# Patient Record
Sex: Female | Born: 1995 | Race: Black or African American | Hispanic: No | Marital: Single | Smoking: Never smoker
Health system: Southern US, Community
[De-identification: ages and names within clinical notes are randomized; demographics above are authoritative.]

---

## 2013-08-07 ENCOUNTER — Other Ambulatory Visit: Payer: Self-pay | Admitting: Emergency Medicine

## 2013-08-07 DIAGNOSIS — R102 Pelvic and perineal pain: Secondary | ICD-10-CM

## 2013-08-12 ENCOUNTER — Other Ambulatory Visit: Payer: Self-pay

## 2013-08-14 ENCOUNTER — Ambulatory Visit
Admission: RE | Admit: 2013-08-14 | Discharge: 2013-08-14 | Disposition: A | Discharge status: Home / Self Care | Payer: BC Managed Care – PPO | Source: Ambulatory Visit | Attending: Emergency Medicine | Admitting: Emergency Medicine

## 2013-08-14 DIAGNOSIS — R102 Pelvic and perineal pain: Secondary | ICD-10-CM

## 2014-04-12 IMAGING — US US PELVIS COMPLETE
1 series · 14 of 25 positions shown · non-contrast
Comparison: None.

CLINICAL DATA: Pelvic pain, cramps, nausea and vomiting

EXAM:
TRANSABDOMINAL ULTRASOUND OF PELVIS
TECHNIQUE: Transabdominal ultrasound examination of the pelvis was performed
including evaluation of the uterus, ovaries, adnexal regions, and
pelvic cul-de-sac.

[Series 1: us pelvis complete · 0.23mm/px · 14 of 48 slices shown]
[im 1/48]
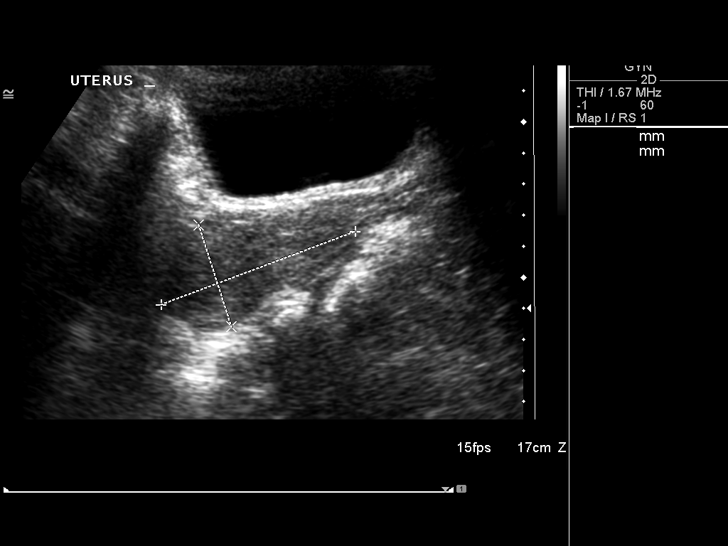
[im 4/48]
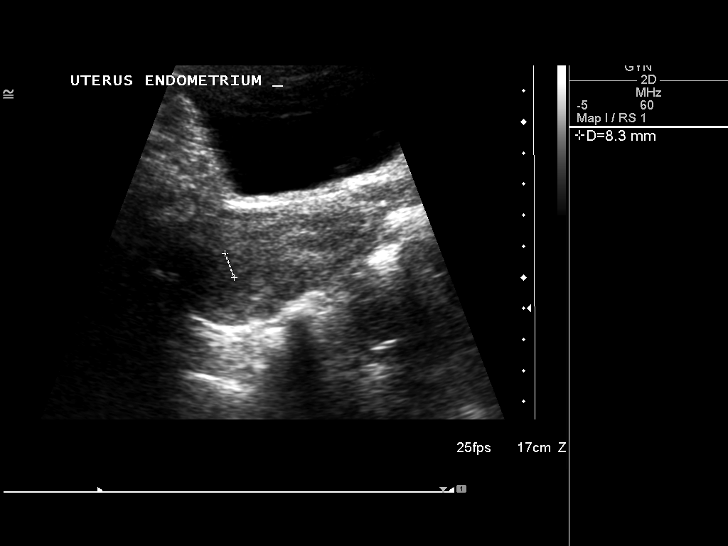
[im 8/48]
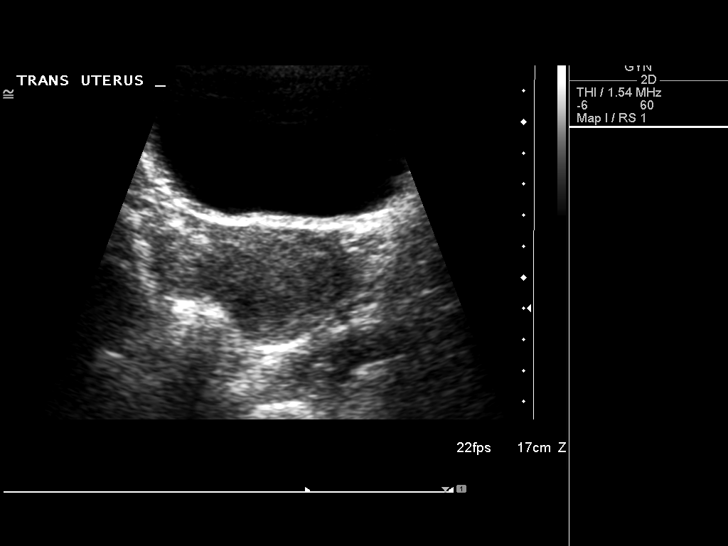
[im 12/48]
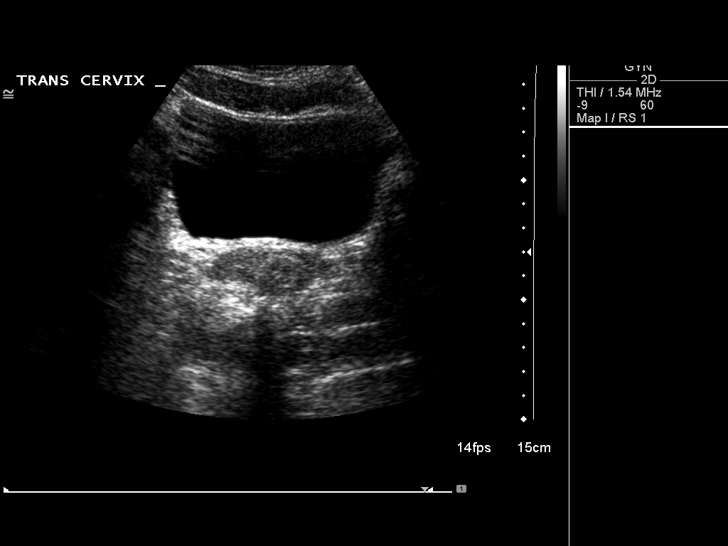
[im 16/48]
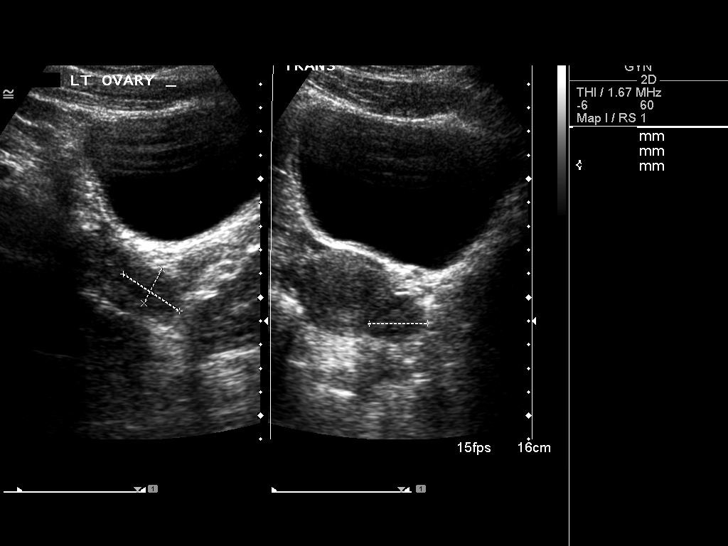
[im 18/48]
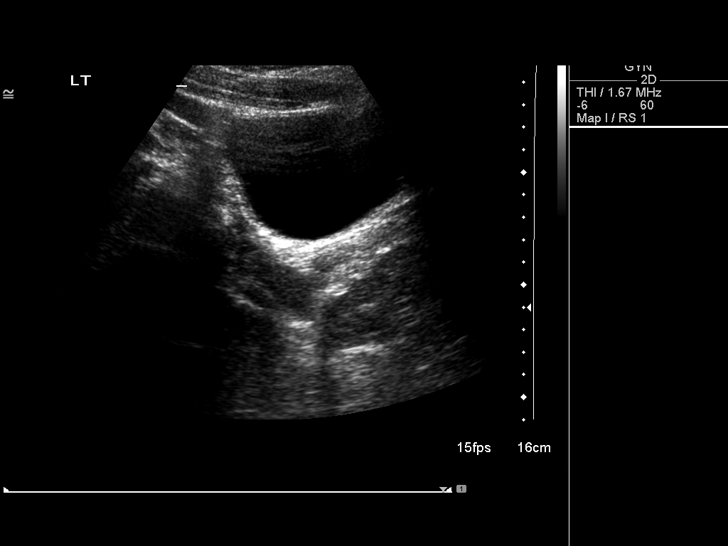
[im 22/48]
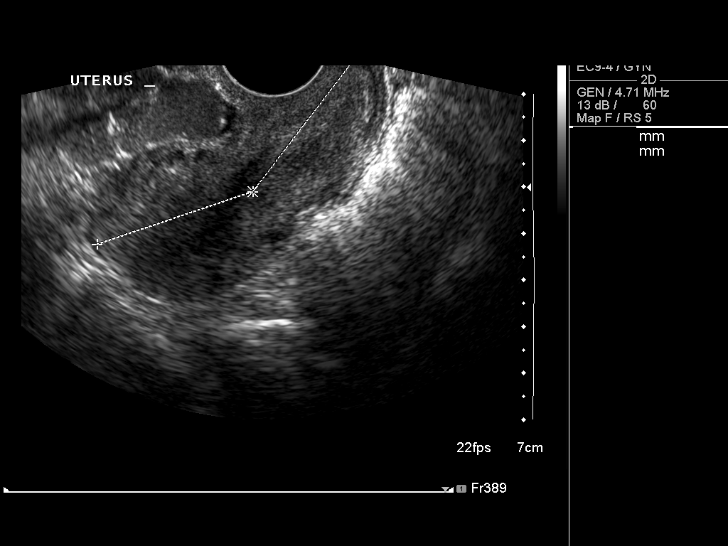
[im 26/48]
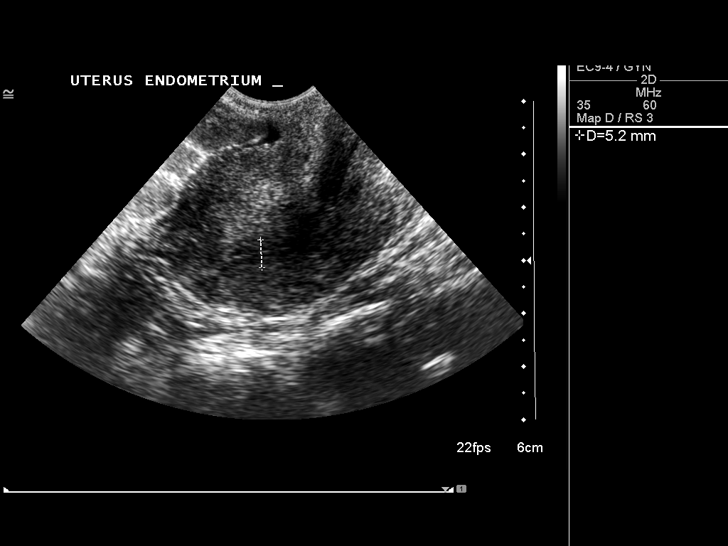
[im 30/48]
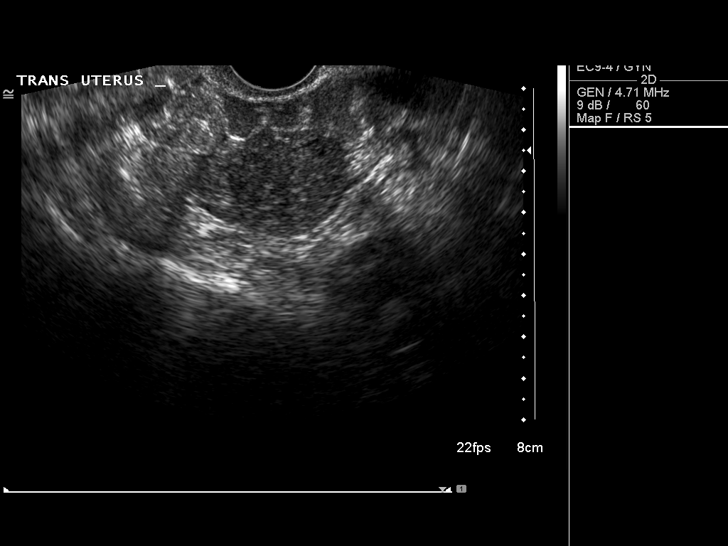
[im 32/48]
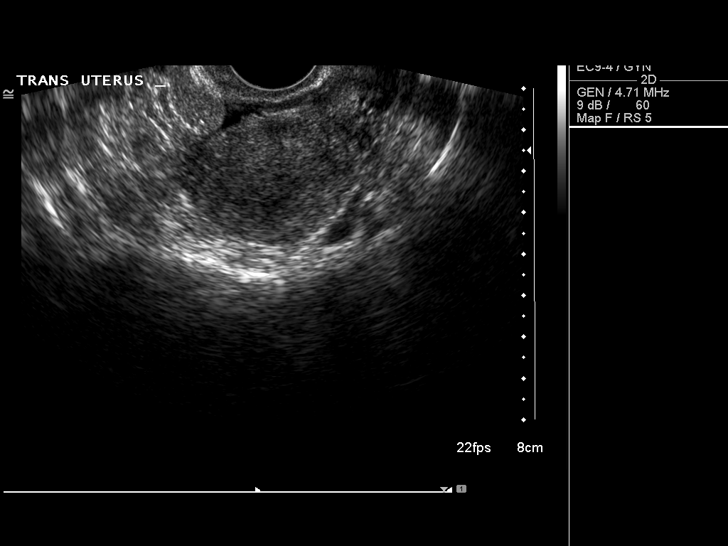
[im 36/48]
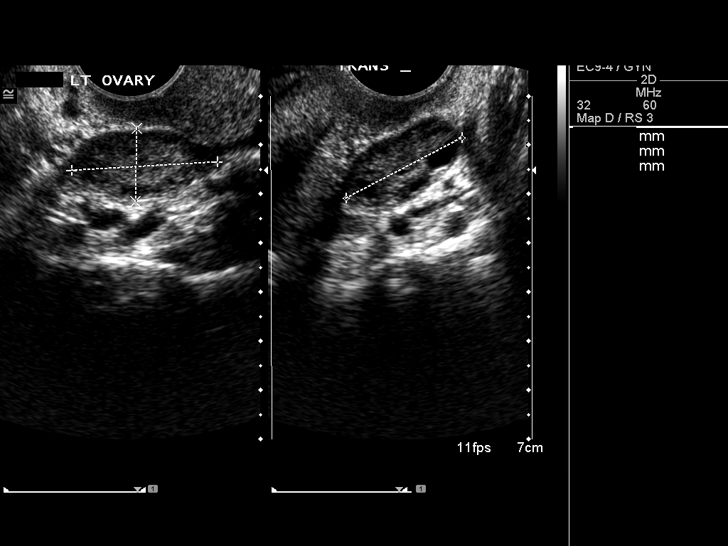
[im 40/48]
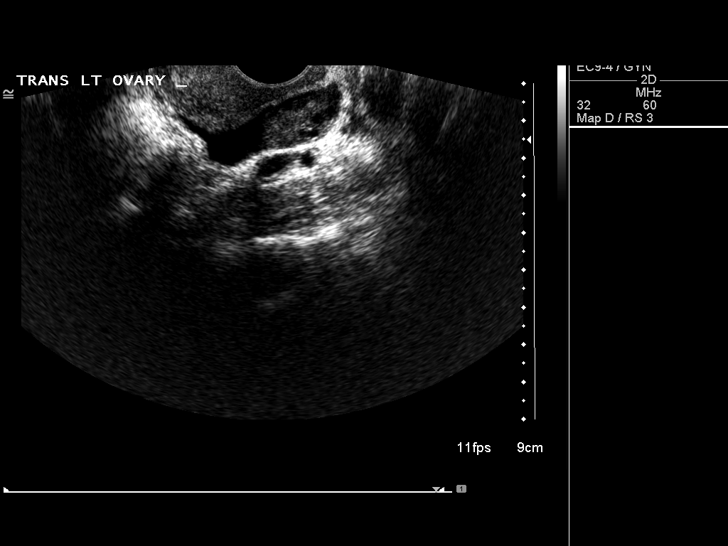
[im 44/48]
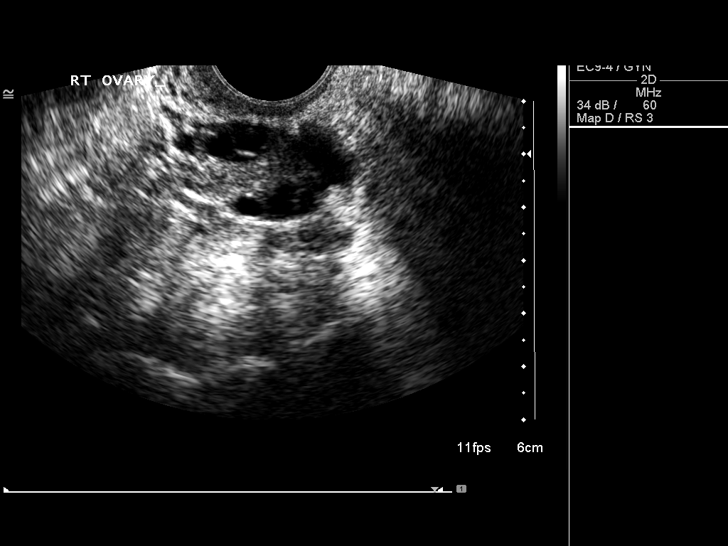
[im 48/48]
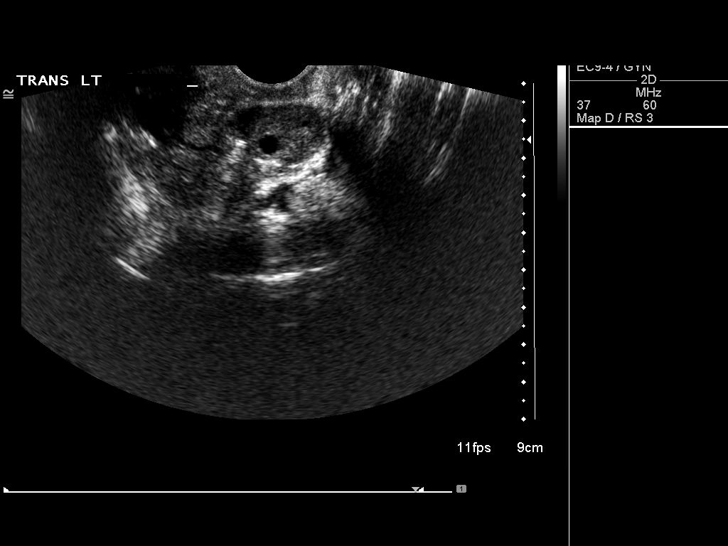

[14 of 25 positions shown; findings below may reference images not displayed]

FINDINGS: Uterus

Measurements: The uterus measures 7.1 cm sagittally with a depth of
3.3 cm and with of 3.7 cm.. No myometrial abnormality is seen.

Endometrium

Thickness: The endometrium measures 5.2 mm in thickness.. No focal
abnormality visualized.

Right ovary

Measurements: The right ovary measures 3.3 x 1.8 x 2.8 cm.. Normal
appearance/no adnexal mass.

Left ovary

Measurements: The left ovary measures 3.0 x 1.5 x 2.7 cm.. Normal
appearance/no adnexal mass.

Other findings: A small amount of free fluid is noted in the pelvis.
IMPRESSION: 1. No abnormality of the uterus.
2. Small amount of free fluid in the pelvis.
3. No ovarian abnormality is seen

## 2014-08-05 ENCOUNTER — Encounter (HOSPITAL_COMMUNITY): Payer: Self-pay | Admitting: Emergency Medicine

## 2014-08-05 ENCOUNTER — Emergency Department (HOSPITAL_COMMUNITY)
Admission: EM | Admit: 2014-08-05 | Discharge: 2014-08-05 | Disposition: A | Discharge status: Home / Self Care | Payer: BC Managed Care – PPO | Source: Home / Self Care | Attending: Family Medicine | Admitting: Family Medicine

## 2014-08-05 DIAGNOSIS — A0811 Acute gastroenteropathy due to Norwalk agent: Secondary | ICD-10-CM

## 2014-08-05 MED ORDER — ONDANSETRON HCL 4 MG/2ML IJ SOLN
INTRAMUSCULAR | Status: AC
  Filled 2014-08-05: qty 2

## 2014-08-05 MED ORDER — ONDANSETRON HCL 4 MG PO TABS: 4.0000 mg | ORAL_TABLET | Freq: Four times a day (QID) | ORAL | Status: DC

## 2014-08-05 MED ORDER — SODIUM CHLORIDE 0.9 % IV BOLUS (SEPSIS)
1000.0000 mL | Freq: Once | INTRAVENOUS | Status: AC
  Administered 2014-08-05: 1000 mL via INTRAVENOUS

## 2014-08-05 MED ORDER — ONDANSETRON HCL 4 MG/2ML IJ SOLN
4.0000 mg | Freq: Once | INTRAMUSCULAR | Status: AC
  Administered 2014-08-05: 4 mg via INTRAVENOUS

## 2014-08-05 NOTE — ED Notes (Signed)
Pt  Feels  Much  Better  Tolerated   Po  Fluids   Well

## 2014-08-05 NOTE — ED Provider Notes (Signed)
CSN: [id number]     Arrival date & time 08/05/14  1517 History   First MD Initiated Contact with Patient 08/05/14 1521     Chief Complaint  Patient presents with  . Emesis   (Consider location/radiation/quality/duration/timing/severity/associated sxs/prior Treatment) Patient is a 18 y.o. female presenting with vomiting. The history is provided by the patient and a friend.  Emesis Severity:  Mild Duration:  1 day Timing:  Intermittent Quality:  Stomach contents Feeding tolerance: nothing. Progression:  Unchanged Chronicity:  New Relieved by:  None tried Worsened by:  Nothing tried Ineffective treatments:  None tried Associated symptoms: diarrhea   Associated symptoms: no chills, no cough and no fever   Risk factors: not pregnant now, no sick contacts and no suspect food intake     History reviewed. No pertinent past medical history. History reviewed. No pertinent past surgical history. History reviewed. No pertinent family history. History  Substance Use Topics  . Smoking status: Never Smoker   . Smokeless tobacco: Not on file  . Alcohol Use: No   OB History   Grav Para Term Preterm Abortions TAB SAB Ect Mult Living                 Review of Systems  Constitutional: Positive for appetite change. Negative for fever and chills.  HENT: Negative.   Respiratory: Negative.   Cardiovascular: Negative.   Gastrointestinal: Positive for nausea, vomiting and diarrhea. Negative for blood in stool and rectal pain.  Genitourinary: Negative.   Skin: Negative.     Allergies  Review of patient's allergies indicates no known allergies.  Home Medications   Prior to Admission medications   Medication Sig Start Date End Date Taking? Authorizing Provider  ondansetron (ZOFRAN) 4 MG tablet Take 1 tablet (4 mg total) by mouth every 6 (six) hours. Prn n/v 08/05/14   Linna HoffJames D Ellenie Salome, MD   BP 124/74  Pulse 72  Temp(Src) 98.6 F (37 C) (Oral)  Resp 18  SpO2 100%  LMP  08/05/2014 Physical Exam  Nursing note and vitals reviewed. Constitutional: She is oriented to person, place, and time. She appears well-developed and well-nourished. No distress.  HENT:  Head: Normocephalic.  Right Ear: External ear normal.  Left Ear: External ear normal.  Mouth/Throat: Oropharynx is clear and moist.  Eyes: EOM are normal. Pupils are equal, round, and reactive to light.  Neck: Normal range of motion. Neck supple.  Cardiovascular: Normal heart sounds.   Pulmonary/Chest: Breath sounds normal.  Abdominal: Soft. Bowel sounds are normal. She exhibits no distension and no mass. There is tenderness. There is no rebound and no guarding.  Lymphadenopathy:    She has no cervical adenopathy.  Neurological: She is alert and oriented to person, place, and time.  Skin: Skin is warm and dry.    ED Course  Procedures (including critical care time) Labs Review Labs Reviewed - No data to display  Imaging Review No results found.   MDM   1. Gastroenteritis due to norovirus        Linna HoffJames D Kyan Giannone, MD 08/05/14 [phone]

## 2014-08-05 NOTE — Discharge Instructions (Signed)
Clear liquid , bland diet tonight as tolerated, advance on wed as improved, use medicine as needed, imodium for diarrhea, return or see your doctor if any problems. °

## 2014-08-05 NOTE — ED Notes (Signed)
IV  NS  1  LITER    BOLUS      20  ANGIO  L  ARM   1  ATT    SITE  PATENT

## 2014-08-05 NOTE — ED Notes (Signed)
NAUSEA   VOMITING  DIARRHEA     WITH  ONSET  OF  SYMPTOMS         YESTERDAY   UNABLE  TO  HOLD  DOWN  LIQUIDS       STOMACH  CRAMPING  WITH THE  DIARRHEA

## 2014-10-29 ENCOUNTER — Encounter (HOSPITAL_BASED_OUTPATIENT_CLINIC_OR_DEPARTMENT_OTHER): Payer: Self-pay

## 2014-10-29 ENCOUNTER — Emergency Department (HOSPITAL_BASED_OUTPATIENT_CLINIC_OR_DEPARTMENT_OTHER)
Admission: EM | Admit: 2014-10-29 | Discharge: 2014-10-29 | Disposition: A | Discharge status: Home / Self Care | Payer: 59 | Attending: Emergency Medicine | Admitting: Emergency Medicine

## 2014-10-29 DIAGNOSIS — R1110 Vomiting, unspecified: Secondary | ICD-10-CM | POA: Diagnosis present

## 2014-10-29 DIAGNOSIS — R112 Nausea with vomiting, unspecified: Secondary | ICD-10-CM | POA: Diagnosis not present

## 2014-10-29 DIAGNOSIS — K226 Gastro-esophageal laceration-hemorrhage syndrome: Secondary | ICD-10-CM | POA: Diagnosis not present

## 2014-10-29 MED ORDER — ONDANSETRON 4 MG PO TBDP: 4.0000 mg | ORAL_TABLET | Freq: Three times a day (TID) | ORAL | Status: AC | PRN

## 2014-10-29 MED ORDER — OMEPRAZOLE 20 MG PO CPDR: 20.0000 mg | DELAYED_RELEASE_CAPSULE | Freq: Two times a day (BID) | ORAL | Status: AC

## 2014-10-29 NOTE — ED Notes (Signed)
MD at bedside assessing patient at this time.

## 2014-10-29 NOTE — ED Provider Notes (Signed)
CSN: [id number]     Arrival date & time 10/29/14  0855 History   First MD Initiated Contact with Patient 10/29/14 [phone]     Chief Complaint  Patient presents with  . Emesis      HPI  Patient presents for evaluation of vomiting. [place] [employer] They did situps, pushups, and a 2 mile run this morning. Over a mile into work as she began to get some abdominal cramping. He nauseated, had to stop. 3 episodes of emesis about 5 minutes apart. By her third episode of retching and emesis and there were some streaks of small amounts of blood in the mucus. She states she feels complete normal now.  Been doing some working out. Admits she was pushing rather hard this morning. Did not get lightheaded dizzy. Has not had any previous episodes of leading, blood in her stools, hematemesis, denies pregnancy. History reviewed. No pertinent past medical history. History reviewed. No pertinent past surgical history. History reviewed. No pertinent family history. History  Substance Use Topics  . Smoking status: Never Smoker   . Smokeless tobacco: Not on file  . Alcohol Use: No   OB History    No data available     Review of Systems  Constitutional: Negative for fever, chills, diaphoresis, appetite change and fatigue.  HENT: Negative for mouth sores, sore throat and trouble swallowing.   Eyes: Negative for visual disturbance.  Respiratory: Negative for cough, chest tightness, shortness of breath and wheezing.   Cardiovascular: Negative for chest pain.  Gastrointestinal: Positive for nausea and vomiting. Negative for abdominal pain, diarrhea and abdominal distention.  Endocrine: Negative for polydipsia, polyphagia and polyuria.  Genitourinary: Negative for dysuria, frequency and hematuria.  Musculoskeletal: Negative for gait problem.  Skin: Negative for color change, pallor and rash.  Neurological: Negative for dizziness, syncope, light-headedness and headaches.  Hematological: Does not bruise/bleed  easily.  Psychiatric/Behavioral: Negative for behavioral problems and confusion.      Allergies  Review of patient's allergies indicates no known allergies.  Home Medications   Prior to Admission medications   Medication Sig Start Date End Date Taking? Authorizing Provider  omeprazole (PRILOSEC) 20 MG capsule Take 1 capsule (20 mg total) by mouth 2 (two) times daily. 10/29/14   Rolland PorterMark Manson Luckadoo, MD  ondansetron (ZOFRAN ODT) 4 MG disintegrating tablet Take 1 tablet (4 mg total) by mouth every 8 (eight) hours as needed for nausea. 10/29/14   Rolland PorterMark Burgundy Matuszak, MD   BP 117/67 mmHg  Pulse 87  Temp(Src) 98.2 F (36.8 C) (Oral)  Resp 16  Ht 5\' 1"  (1.549 m)  Wt 132 lb (59.875 kg)  BMI 24.95 kg/m2  SpO2 100% Physical Exam  Constitutional: She is oriented to person, place, and time. She appears well-developed and well-nourished. No distress.  HENT:  Head: Normocephalic.  Eyes: Conjunctivae are normal. Pupils are equal, round, and reactive to light. No scleral icterus.  Inject are not pale. No scleral icterus.  Neck: Normal range of motion. Neck supple. No thyromegaly present.  Cardiovascular: Normal rate and regular rhythm.  Exam reveals no gallop and no friction rub.   No murmur heard. Not tachycardic at rest.  Pulmonary/Chest: Effort normal and breath sounds normal. No respiratory distress. She has no wheezes. She has no rales.  Clear bilateral breath sounds.  Abdominal: Soft. Bowel sounds are normal. She exhibits no distension. There is no tenderness. There is no rebound.  Soft benign abdomen.  Musculoskeletal: Normal range of motion.  Neurological: She is alert  and oriented to person, place, and time.  Skin: Skin is warm and dry. No rash noted.  Psychiatric: She has a normal mood and affect. Her behavior is normal.    ED Course  Procedures (including critical care time) Labs Review Labs Reviewed - No data to display  Imaging Review No results found.   EKG Interpretation None       MDM   Final diagnoses:  Non-intractable vomiting with nausea, vomiting of unspecified type  Mallory-Weiss tear    She is asymptomatic now. I do not have great concern about her symptoms. She was working out on an empty stomach had some nausea and retching and produced a small amount of blood with her emesis. Benign abdominal exam. Normal vital signs. Normal baseline health. Plan will be discharge. No diagnostics indicated. Proton pump inhibitor for the next 1 week. I have asked her to have some PO intake before any additional strenuous workouts. We discussed proper eating and proper diet before and after her workouts.    Rolland Porter, MD 10/29/14 [phone]

## 2014-10-29 NOTE — Discharge Instructions (Signed)
Nausea and Vomiting Nausea is a sick feeling that often comes before throwing up (vomiting). Vomiting is a reflex where stomach contents come out of your mouth. Vomiting can cause severe loss of body fluids (dehydration). Children and elderly adults can become dehydrated quickly, especially if they also have diarrhea. Nausea and vomiting are symptoms of a condition or disease. It is important to find the cause of your symptoms. CAUSES   Direct irritation of the stomach lining. This irritation can result from increased acid production (gastroesophageal reflux disease), infection, food poisoning, taking certain medicines (such as nonsteroidal anti-inflammatory drugs), alcohol use, or tobacco use.  Signals from the brain.These signals could be caused by a headache, heat exposure, an inner ear disturbance, increased pressure in the brain from injury, infection, a tumor, or a concussion, pain, emotional stimulus, or metabolic problems.  An obstruction in the gastrointestinal tract (bowel obstruction).  Illnesses such as diabetes, hepatitis, gallbladder problems, appendicitis, kidney problems, cancer, sepsis, atypical symptoms of a heart attack, or eating disorders.  Medical treatments such as chemotherapy and radiation.  Receiving medicine that makes you sleep (general anesthetic) during surgery. DIAGNOSIS Your caregiver may ask for tests to be done if the problems do not improve after a few days. Tests may also be done if symptoms are severe or if the reason for the nausea and vomiting is not clear. Tests may include:  Urine tests.  Blood tests.  Stool tests.  Cultures (to look for evidence of infection).  X-rays or other imaging studies. Test results can help your caregiver make decisions about treatment or the need for additional tests. TREATMENT You need to stay well hydrated. Drink frequently but in small amounts.You may wish to drink water, sports drinks, clear broth, or eat frozen  ice pops or gelatin dessert to help stay hydrated.When you eat, eating slowly may help prevent nausea.There are also some antinausea medicines that may help prevent nausea. HOME CARE INSTRUCTIONS   Take all medicine as directed by your caregiver.  If you do not have an appetite, do not force yourself to eat. However, you must continue to drink fluids.  If you have an appetite, eat a normal diet unless your caregiver tells you differently.  Eat a variety of complex carbohydrates (rice, wheat, potatoes, bread), lean meats, yogurt, fruits, and vegetables.  Avoid high-fat foods because they are more difficult to digest.  Drink enough water and fluids to keep your urine clear or pale yellow.  If you are dehydrated, ask your caregiver for specific rehydration instructions. Signs of dehydration may include:  Severe thirst.  Dry lips and mouth.  Dizziness.  Dark urine.  Decreasing urine frequency and amount.  Confusion.  Rapid breathing or pulse. SEEK IMMEDIATE MEDICAL CARE IF:   You have blood or brown flecks (like coffee grounds) in your vomit.  You have black or bloody stools.  You have a severe headache or stiff neck.  You are confused.  You have severe abdominal pain.  You have chest pain or trouble breathing.  You do not urinate at least once every 8 hours.  You develop cold or clammy skin.  You continue to vomit for longer than 24 to 48 hours.  You have a fever. MAKE SURE YOU:   Understand these instructions.  Will watch your condition.  Will get help right away if you are not doing well or get worse. Document Released: 10/03/2005 Document Revised: 12/26/2011 Document Reviewed: 03/02/2011 [employer] Patient Information 2015 [employer]. This information is not intended   to replace advice given to you by your health care provider. Make sure you discuss any questions you have with your health care provider.  Mallory-Weiss Syndrome Mallory-Weiss syndrome  refers to bleeding from tears in the lining of the esophagus near where it meets the stomach. This is often caused by forceful vomiting, retching or coughing. This condition is often associated with alcoholism. Usually the bleeding stops by itself after 24 to 48 hours. Sometimes endoscopic or surgical treatment is needed. This condition is not usually fatal. SYMPTOMS  Vomiting of bright red or black coffee ground like material.  Black, tarry stools.  Low blood pressure causing you to feel faint or experience loss of consciousness. DIAGNOSIS  Definitive diagnosis is by endoscopy. Treatment is usually supportive. Persistent bleeding is uncommon. Sometimes cauterization or injection of epinephrine to stop the bleeding is used during the diagnostic endoscopy. Embolization (obstruction) of the arteries supplying the area of bleeding is sometimes used to stop the bleeding.  An NG tube (naso-gastric tube) may be inserted to determine where the bleeding is coming from.  Often an EGD (esophagogastroduodenoscopy) is done. In this procedure there is a small flexible tube-like telescope (endoscope) put into your mouth, through your esophagus (the food tube leading from your mouth to your stomach), down into your stomach and into the small bowel. Through this your caregiver can see what and where the problem is. TREATMENT  It is necessary to stop the bleeding as soon as possible. During the EGD, your caregiver may inject medication into bleeding vessels to clot them.  SEEK IMMEDIATE MEDICAL CARE IF:  You have persistent dizziness, lightheadedness, or fainting.  Your vomiting returns and you have blood in your stools.  You have vomit that is bright red blood or black coffee ground-like blood, bright red blood in the stool or black tarry stools.  You have chest pain.  You cannot eat or drink.  You have nausea or vomiting. MAKE SURE YOU:   Understand these instructions.  Will watch your  condition.  Will get help right away if you are not doing well or get worse. Document Released: 02/20/2006 Document Revised: 12/26/2011 Document Reviewed: 11/27/2013 [facility] Patient Information 2015 [employer] [place]. This information is not intended to replace advice given to you by your health care provider. Make sure you discuss any questions you have with your health care provider.

## 2014-10-29 NOTE — ED Notes (Signed)
Pt reports onset of spitting up blood after 2 mile run for PT test. Denies abd pain.

## 2015-08-31 ENCOUNTER — Other Ambulatory Visit: Payer: Self-pay | Admitting: Obstetrics & Gynecology

## 2015-08-31 ENCOUNTER — Other Ambulatory Visit (HOSPITAL_COMMUNITY)
Admission: RE | Admit: 2015-08-31 | Discharge: 2015-08-31 | Disposition: A | Discharge status: Home / Self Care | Payer: 59 | Source: Ambulatory Visit | Attending: Obstetrics & Gynecology | Admitting: Obstetrics & Gynecology

## 2015-08-31 DIAGNOSIS — Z113 Encounter for screening for infections with a predominantly sexual mode of transmission: Secondary | ICD-10-CM | POA: Insufficient documentation

## 2015-08-31 DIAGNOSIS — Z01419 Encounter for gynecological examination (general) (routine) without abnormal findings: Secondary | ICD-10-CM | POA: Insufficient documentation

## 2015-09-01 LAB — CYTOLOGY - PAP
